# Patient Record
Sex: Male | Born: 1965 | Smoking: Never smoker
Health system: Southern US, Community
[De-identification: ages and names within clinical notes are randomized; demographics above are authoritative.]

## PROBLEM LIST (undated history)

## (undated) DIAGNOSIS — I10 Essential (primary) hypertension: Secondary | ICD-10-CM

---

## 2015-05-07 ENCOUNTER — Encounter (HOSPITAL_COMMUNITY): Payer: Self-pay | Admitting: Emergency Medicine

## 2015-05-07 ENCOUNTER — Emergency Department (HOSPITAL_COMMUNITY): Payer: BC Managed Care – PPO

## 2015-05-07 ENCOUNTER — Emergency Department (HOSPITAL_COMMUNITY)
Admission: EM | Admit: 2015-05-07 | Discharge: 2015-05-07 | Disposition: A | Discharge status: Home / Self Care | Payer: BC Managed Care – PPO | Attending: Emergency Medicine | Admitting: Emergency Medicine

## 2015-05-07 DIAGNOSIS — N200 Calculus of kidney: Secondary | ICD-10-CM | POA: Diagnosis not present

## 2015-05-07 DIAGNOSIS — R7989 Other specified abnormal findings of blood chemistry: Secondary | ICD-10-CM | POA: Diagnosis not present

## 2015-05-07 DIAGNOSIS — I10 Essential (primary) hypertension: Secondary | ICD-10-CM | POA: Insufficient documentation

## 2015-05-07 DIAGNOSIS — R109 Unspecified abdominal pain: Secondary | ICD-10-CM

## 2015-05-07 HISTORY — DX: Essential (primary) hypertension: I10

## 2015-05-07 LAB — COMPREHENSIVE METABOLIC PANEL
ALT: 22 U/L (ref 17–63)
AST: 24 U/L (ref 15–41)
Albumin: 3.8 g/dL (ref 3.5–5.0)
Alkaline Phosphatase: 61 U/L (ref 38–126)
Anion gap: 8 (ref 5–15)
BILIRUBIN TOTAL: 0.8 mg/dL (ref 0.3–1.2)
BUN: 12 mg/dL (ref 6–20)
CALCIUM: 8.5 mg/dL — AB (ref 8.9–10.3)
CHLORIDE: 108 mmol/L (ref 101–111)
CO2: 25 mmol/L (ref 22–32)
Creatinine, Ser: 1.45 mg/dL — ABNORMAL HIGH (ref 0.61–1.24)
GFR, EST NON AFRICAN AMERICAN: 56 mL/min — AB (ref >60)
GLUCOSE: 106 mg/dL — AB (ref 65–99)
Potassium: 3.6 mmol/L (ref 3.5–5.1)
Sodium: 141 mmol/L (ref 135–145)
Total Protein: 6.5 g/dL (ref 6.5–8.1)

## 2015-05-07 LAB — URINALYSIS, ROUTINE W REFLEX MICROSCOPIC
Bilirubin Urine: NEGATIVE
Glucose, UA: NEGATIVE mg/dL
HGB URINE DIPSTICK: NEGATIVE
Ketones, ur: NEGATIVE mg/dL
Leukocytes, UA: NEGATIVE
Nitrite: NEGATIVE
PROTEIN: NEGATIVE mg/dL
Specific Gravity, Urine: 1.024 (ref 1.005–1.030)
UROBILINOGEN UA: 1 mg/dL (ref 0.0–1.0)
pH: 6 (ref 5.0–8.0)

## 2015-05-07 LAB — LIPASE, BLOOD: LIPASE: 23 U/L (ref 22–51)

## 2015-05-07 LAB — CBC WITH DIFFERENTIAL/PLATELET
Basophils Absolute: 0 10*3/uL (ref 0.0–0.1)
Basophils Relative: 0 % (ref 0–1)
Eosinophils Absolute: 0.1 10*3/uL (ref 0.0–0.7)
Eosinophils Relative: 2 % (ref 0–5)
HCT: 41.5 % (ref 39.0–52.0)
Hemoglobin: 13.6 g/dL (ref 13.0–17.0)
LYMPHS ABS: 1.9 10*3/uL (ref 0.7–4.0)
Lymphocytes Relative: 26 % (ref 12–46)
MCH: 28.2 pg (ref 26.0–34.0)
MCHC: 32.8 g/dL (ref 30.0–36.0)
MCV: 86.1 fL (ref 78.0–100.0)
Monocytes Absolute: 0.6 10*3/uL (ref 0.1–1.0)
Monocytes Relative: 8 % (ref 3–12)
NEUTROS ABS: 4.7 10*3/uL (ref 1.7–7.7)
NEUTROS PCT: 64 % (ref 43–77)
Platelets: 180 10*3/uL (ref 150–400)
RBC: 4.82 MIL/uL (ref 4.22–5.81)
RDW: 13.3 % (ref 11.5–15.5)
WBC: 7.3 10*3/uL (ref 4.0–10.5)

## 2015-05-07 MED ORDER — SODIUM CHLORIDE 0.9 % IV BOLUS (SEPSIS)
1000.0000 mL | Freq: Once | INTRAVENOUS | Status: AC
  Administered 2015-05-07: 1000 mL via INTRAVENOUS

## 2015-05-07 MED ORDER — TAMSULOSIN HCL 0.4 MG PO CAPS: 0.4000 mg | ORAL_CAPSULE | Freq: Every day | ORAL | Status: AC

## 2015-05-07 MED ORDER — HYDROCODONE-ACETAMINOPHEN 5-325 MG PO TABS: 1.0000 | ORAL_TABLET | Freq: Four times a day (QID) | ORAL | Status: AC | PRN

## 2015-05-07 MED ORDER — NAPROXEN 500 MG PO TABS: 500.0000 mg | ORAL_TABLET | Freq: Two times a day (BID) | ORAL | Status: AC | PRN

## 2015-05-07 MED ORDER — KETOROLAC TROMETHAMINE 30 MG/ML IJ SOLN
30.0000 mg | Freq: Once | INTRAMUSCULAR | Status: AC
  Administered 2015-05-07: 30 mg via INTRAVENOUS
  Filled 2015-05-07: qty 1

## 2015-05-07 NOTE — ED Notes (Signed)
Patient here from [place] with c/o of sudden onset right flank pain non radiating. Denies n/v/d. No history of same.

## 2015-05-07 NOTE — ED Provider Notes (Signed)
CSN: [id number]     Arrival date & time 05/07/15  1348 History   First MD Initiated Contact with Patient 05/07/15 1350     Chief Complaint  Patient presents with  . Flank Pain     (Consider location/radiation/quality/duration/timing/severity/associated sxs/prior Treatment) HPI Comments: Wayne Powers is a 49 y.o. male with a PMHx of HTN, who presents to the ED with complaints of sudden onset right flank pain that began at 1 PM. He reports that initially it was 10/10, but now has improved to 3/10 in severity without any treatment prior to arrival. He reports that the flank pain is sharp, waxing and waning, constant, radiating to the right lateral/lower abdominal quadrant, with no known aggravating or alleviating factors given that he has not tried anything prior to arrival, and states that it feels like it's improving on its own. He denies any fevers, chills, chest pain, shortness breath, nausea, vomiting, diarrhea, constipation, obstipation, melena, hematochezia, dysuria, hematuria, urinary frequency, testicular pain or swelling, penile discharge, numbness, tingling, weakness, recent travel, sick contacts, suspicious food intake, alcohol use, NSAIDs, or recent antibiotics. Denies any history of prior similar symptoms. No hx of kidney stones or kidney issues.  Patient is a 50 y.o. male presenting with flank pain. The history is provided by the patient. No language interpreter was used.  Flank Pain This is a new problem. The current episode started today. The problem occurs constantly. The problem has been waxing and waning. Associated symptoms include abdominal pain (R lateral lower abd, radiating from flank). Pertinent negatives include no arthralgias, chest pain, chills, fever, myalgias, nausea, numbness, urinary symptoms, vomiting or weakness. Nothing aggravates the symptoms. He has tried nothing for the symptoms. The treatment provided no relief.    Past Medical History  Diagnosis Date  .  Hypertension    History reviewed. No pertinent past surgical history. History reviewed. No pertinent family history. History  Substance Use Topics  . Smoking status: Never Smoker   . Smokeless tobacco: Not on file  . Alcohol Use: No    Review of Systems  Constitutional: Negative for fever and chills.  Respiratory: Negative for shortness of breath.   Cardiovascular: Negative for chest pain.  Gastrointestinal: Positive for abdominal pain (R lateral lower abd, radiating from flank). Negative for nausea, vomiting, diarrhea, constipation and blood in stool.  Genitourinary: Positive for flank pain. Negative for dysuria, hematuria, discharge, scrotal swelling and testicular pain.  Musculoskeletal: Negative for myalgias and arthralgias.  Skin: Negative for color change.  Allergic/Immunologic: Negative for immunocompromised state.  Neurological: Negative for weakness and numbness.  Psychiatric/Behavioral: Negative for confusion.   10 Systems reviewed and are negative for acute change except as noted in the HPI.    Allergies  Review of patient's allergies indicates not on file.  Home Medications   Prior to Admission medications   Not on File   BP 138/91 mmHg  Pulse 62  Temp(Src) 98 F (36.7 C) (Oral)  Resp 15  SpO2 100% Physical Exam  Constitutional: He is oriented to person, place, and time. Vital signs are normal. He appears well-developed and well-nourished.  Non-toxic appearance. No distress.  Afebrile, nontoxic, NAD  HENT:  Head: Normocephalic and atraumatic.  Mouth/Throat: Oropharynx is clear and moist and mucous membranes are normal.  Eyes: Conjunctivae and EOM are normal. Right eye exhibits no discharge. Left eye exhibits no discharge.  Neck: Normal range of motion. Neck supple.  Cardiovascular: Normal rate, regular rhythm, normal heart sounds and intact distal pulses.  Exam reveals  no gallop and no friction rub.   No murmur heard. Pulmonary/Chest: Effort normal and  breath sounds normal. No respiratory distress. He has no decreased breath sounds. He has no wheezes. He has no rhonchi. He has no rales.  Abdominal: Soft. Normal appearance and bowel sounds are normal. He exhibits no distension. There is tenderness in the right lower quadrant. There is CVA tenderness (R sided). There is no rigidity, no rebound, no guarding, no tenderness at McBurney's point and negative Murphy's sign.    Soft, nondistended, +BS throughout, with R lateral abdomen tenderness at lateral edge of abdomen, approaching RLQ, no r/g/r, neg murphy's, neg mcburney's, +R sided CVA TTP, neg psoas sign, neg foot tap test  Musculoskeletal: Normal range of motion.  MAE x4 Strength and sensation grossly intact Distal pulses intact Gait steady  Neurological: He is alert and oriented to person, place, and time. He has normal strength. No sensory deficit.  Skin: Skin is warm, dry and intact. No rash noted.  Psychiatric: He has a normal mood and affect.  Nursing note and vitals reviewed.   ED Course  Procedures (including critical care time) Labs Review Labs Reviewed  COMPREHENSIVE METABOLIC PANEL - Abnormal; Notable for the following:    Glucose, Bld 106 (*)    Creatinine, Ser 1.45 (*)    Calcium 8.5 (*)    GFR calc non Af Amer 56 (*)    All other components within normal limits  CBC WITH DIFFERENTIAL/PLATELET  LIPASE, BLOOD  URINALYSIS, ROUTINE W REFLEX MICROSCOPIC (NOT AT [facility])    Imaging Review Ct Renal Stone Study  05/07/2015   CLINICAL DATA:  Right flank pain radiating to the right lower quadrant beginning earlier today.  EXAM: CT ABDOMEN AND PELVIS WITHOUT CONTRAST  TECHNIQUE: Multidetector CT imaging of the abdomen and pelvis was performed following the standard protocol without IV contrast.  COMPARISON:  None.  FINDINGS: Minimal dependent atelectasis is noted in the lung bases.  The liver, gallbladder, spleen, adrenal glands, and pancreas have an unremarkable unenhanced  appearance.  There are numerous punctate calculi measuring up to approximately 3 cm in size throughout both kidneys. There is mild stranding about the right ureter, and there is a 3 mm stone in the distal right ureter. There is very mild right hydronephrosis. No left hydronephrosis or left ureteral calculi are identified. There is mild scarring in the left upper pole.  There is no evidence of bowel obstruction or wall thickening. Appendix is identified in the right lower quadrant and is unremarkable. Bladder is unremarkable. No free fluid or enlarged lymph nodes are identified. Enthesophytes are noted at the lesser trochanters of the femurs bilaterally with linear calcifications present in the distal left iliopsoas tendon.  IMPRESSION: 1. 3 mm distal right ureteral stone with mild hydronephrosis. 2. Bilateral nonobstructing renal calculi.   Electronically Signed   By: Sebastian Ache   On: 05/07/2015 14:39     EKG Interpretation None      MDM   Final diagnoses:  Right flank pain  Nephrolithiasis  Elevated serum creatinine    49 y.o. male here with R flank pain, sudden onset 1hr PTA. Improving now without intervention, but waxes/wanes. No hx of similar symptoms. Radiates to RLQ. Abd exam reveals R lateral abd tenderness, no mcburney's tenderness, nonperitoneal. +R CVA TTP. DDx includes nephrolithiasis, appendicitis, musculoskeletal pain, etc. Will obtain labs, u/a, and CT renal search. Will give toradol and fluids. Will reassess shortly.   3:37 PM Pain improving. CT renal study showing  3mm stone in distal R ureter, and scattered b/l punctate stones in kidneys bilaterally. Pain controlled. CBC WNL, U/A unremarkable. CMP and lipase still pending. Discussed that as long as these are WNL, pt to be discharged with naprosyn/norco and flomax. Discussed urine strainer. Will have him use flomax only until stone has passed, or until symptoms have resolved (in the event he hasn't caught the stone). Will have him  f/up with urology. Pt doesn't live here, will have him f/up in [place] with urology, but still gave [facility]'s number so pt has at least one contact. Will await CMP and lipase then likely d/c.    3:51 PM Cr 1.45, unknown if this is his baseline or potentially just due to some slight dehydration. 1L bolus given here, pt tolerating PO well without nausea, no ongoing pain after toradol. Discussed that he will need to stay very hydrated with water, and I feel he is safe to d/c with previously discussed plan. I explained the diagnosis and have given explicit precautions to return to the ER including for any other new or worsening symptoms. The patient understands and accepts the medical plan as it's been dictated and I have answered their questions. Discharge instructions concerning home care and prescriptions have been given. The patient is STABLE and is discharged to home in good condition.  BP 138/91 mmHg  Pulse 62  Temp(Src) 98 F (36.7 C) (Oral)  Resp 15  SpO2 100%  Meds ordered this encounter  Medications  . sodium chloride 0.9 % bolus 1,000 mL    Sig:   . ketorolac (TORADOL) 30 MG/ML injection 30 mg    Sig:   . naproxen (NAPROSYN) 500 MG tablet    Sig: Take 1 tablet (500 mg total) by mouth 2 (two) times daily as needed for mild pain, moderate pain or headache (TAKE WITH MEALS.).    Dispense:  20 tablet    Refill:  0    Order Specific Question:  Supervising Provider    Answer:  MILLER, BRIAN [[id number]]  . HYDROcodone-acetaminophen (NORCO) 5-325 MG per tablet    Sig: Take 1 tablet by mouth every 6 (six) hours as needed for severe pain.    Dispense:  6 tablet    Refill:  0    Order Specific Question:  Supervising Provider    Answer:  MILLER, BRIAN [[id number]]  . tamsulosin (FLOMAX) 0.4 MG CAPS capsule    Sig: Take 1 capsule (0.4 mg total) by mouth daily after supper. Until the kidney stone has passed    Dispense:  15 capsule    Refill:  0    Order Specific Question:  Supervising  Provider    Answer:  Eber Hong [[id number]]      Devante Capano Camprubi-Soms, PA-C 05/07/15 1552  Tilden Fossa, MD 05/08/15 [phone]

## 2015-05-07 NOTE — Discharge Instructions (Signed)
Take naprosyn as directed as needed for inflammation and pain using tylenol or norco for breakthrough pain. Do not drive or operate machinery with pain medication use. Strain all urine to catch the stone when it passes. Use Flomax as directed until the stone has passed, as this medication will help you pass the stone. Stay VERY WELL HYDRATED. Followup with urologist in the next 1 week for recheck of ongoing pain, however for intractable or uncontrollable pain at home then return to the emergency department.    Kidney Stones Kidney stones (urolithiasis) are solid masses that form inside your kidneys. The intense pain is caused by the stone moving through the kidney, ureter, bladder, and urethra (urinary tract). When the stone moves, the ureter starts to spasm around the stone. The stone is usually passed in your pee (urine).  HOME CARE  Drink enough fluids to keep your pee clear or pale yellow. This helps to get the stone out.  Strain all pee through the provided strainer. Do not pee without peeing through the strainer, not even once. If you pee the stone out, catch it in the strainer. The stone may be as small as a grain of salt. Take this to your doctor. This will help your doctor figure out what you can do to try to prevent more kidney stones.  Only take medicine as told by your doctor.  Follow up with your doctor as told.  Get follow-up X-rays as told by your doctor. GET HELP IF: You have pain that gets worse even if you have been taking pain medicine. GET HELP RIGHT AWAY IF:   Your pain does not get better with medicine.  You have a fever or shaking chills.  Your pain increases and gets worse over 18 hours.  You have new belly (abdominal) pain.  You feel faint or pass out.  You are unable to pee. MAKE SURE YOU:   Understand these instructions.  Will watch your condition.  Will get help right away if you are not doing well or get worse. Document Released: 04/26/2008 Document  Revised: 07/11/2013 Document Reviewed: 04/11/2013 [facility] Patient Information 2015 [place], [place]. This information is not intended to replace advice given to you by your health care provider. Make sure you discuss any questions you have with your health care provider.  Dietary Guidelines to Help Prevent Kidney Stones Your risk of kidney stones can be decreased by adjusting the foods you eat. The most important thing you can do is drink enough fluid. You should drink enough fluid to keep your urine clear or pale yellow. The following guidelines provide specific information for the type of kidney stone you have had. GUIDELINES ACCORDING TO TYPE OF KIDNEY STONE Calcium Oxalate Kidney Stones  Reduce the amount of salt you eat. Foods that have a lot of salt cause your body to release excess calcium into your urine. The excess calcium can combine with a substance called oxalate to form kidney stones.  Reduce the amount of animal protein you eat if the amount you eat is excessive. Animal protein causes your body to release excess calcium into your urine. Ask your dietitian how much protein from animal sources you should be eating.  Avoid foods that are high in oxalates. If you take vitamins, they should have less than 500 mg of vitamin C. Your body turns vitamin C into oxalates. You do not need to avoid fruits and vegetables high in vitamin C. Calcium Phosphate Kidney Stones  Reduce the amount of salt  you eat to help prevent the release of excess calcium into your urine.  Reduce the amount of animal protein you eat if the amount you eat is excessive. Animal protein causes your body to release excess calcium into your urine. Ask your dietitian how much protein from animal sources you should be eating.  Get enough calcium from food or take a calcium supplement (ask your dietitian for recommendations). Food sources of calcium that do not increase your risk of kidney stones include:  Broccoli.  Dairy  products, such as cheese and yogurt.  Pudding. Uric Acid Kidney Stones  Do not have more than 6 oz of animal protein per day. FOOD SOURCES Animal Protein Sources  Meat (all types).  Poultry.  Eggs.  Fish, seafood. Foods High in [employer] seasonings.  Soy sauce.  Teriyaki sauce.  Cured and processed meats.  Salted crackers and snack foods.  Fast food.  Canned soups and most canned foods. Foods High in Oxalates  Grains:  Amaranth.  Barley.  Grits.  Wheat germ.  Bran.  Buckwheat flour.  All bran cereals.  Pretzels.  Whole wheat bread.  Vegetables:  Beans (wax).  Beets and beet greens.  Collard greens.  Eggplant.  [place]ole.  Leeks.  Okra.  Parsley.  Rutabagas.  Spinach.  Swiss chard.  Tomato paste.  Fried potatoes.  Sweet potatoes.  Fruits:  Red currants.  Figs.  Kiwi.  Rhubarb.  Meat and Other Protein Sources:  Beans (dried).  Soy burgers and other soybean products.  Miso.  Nuts (peanuts, almonds, pecans, cashews, hazelnuts).  Nut butters.  Sesame seeds and tahini (paste made of sesame seeds).  Poppy seeds.  Beverages:  Chocolate drink mixes.  Soy milk.  Instant iced tea.  Juices made from high-oxalate fruits or vegetables.  Other:  Carob.  Chocolate.  Fruitcake.  Marmalades. Document Released: 03/05/2011 Document Revised: 11/13/2013 Document Reviewed: 10/05/2013 [facility] Patient Information 2015 [place], [place]. This information is not intended to replace advice given to you by your health care provider. Make sure you discuss any questions you have with your health care provider.

## 2015-05-07 NOTE — ED Notes (Signed)
Bed: WA09 Expected date:  Expected time:  Means of arrival:  Comments: Ems- flank pain

## 2017-01-18 IMAGING — CT CT RENAL STONE PROTOCOL
2 of 3 series · 17 of 32 positions shown, 19 images · non-contrast
Comparison: None.

CLINICAL DATA: Right flank pain radiating to the right lower
quadrant beginning earlier today.

EXAM:
CT ABDOMEN AND PELVIS WITHOUT CONTRAST
TECHNIQUE: Multidetector CT imaging of the abdomen and pelvis was performed
following the standard protocol without IV contrast.

[Series 4: lung windows · axial · 0.69mm/px · z∈[-100,-26]mm · 14 of 18 slices shown, 16 images]
[im 2/18  soft-tissue]
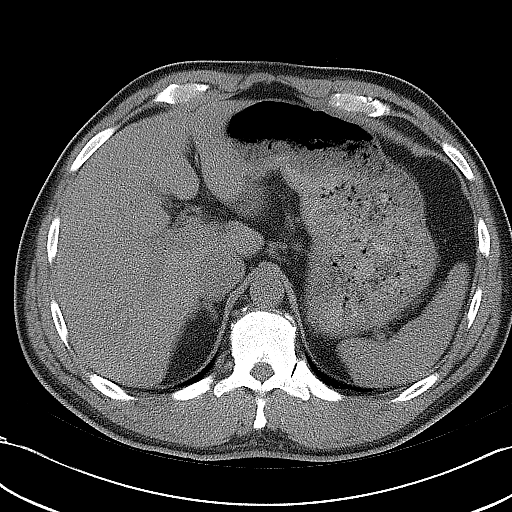
[im 2/18  bone]
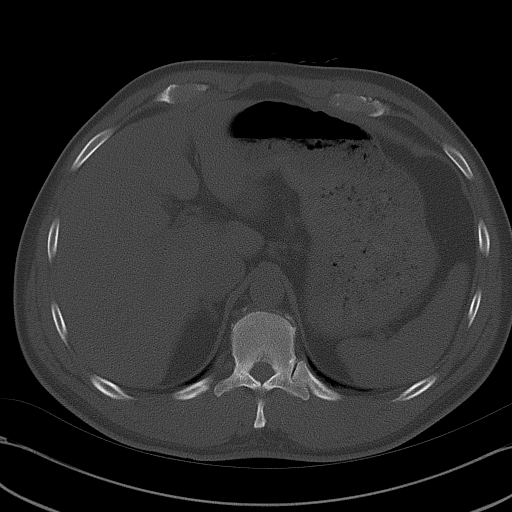
[im 3/18  soft-tissue]
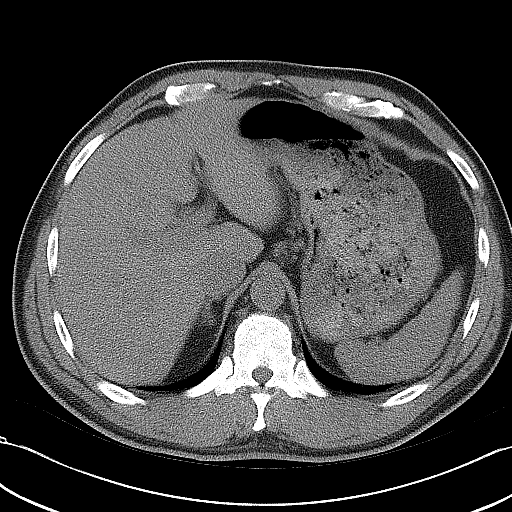
[im 4/18  soft-tissue]
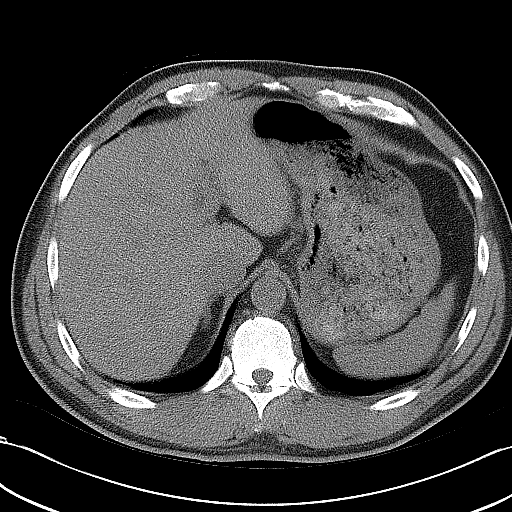
[im 6/18  soft-tissue]
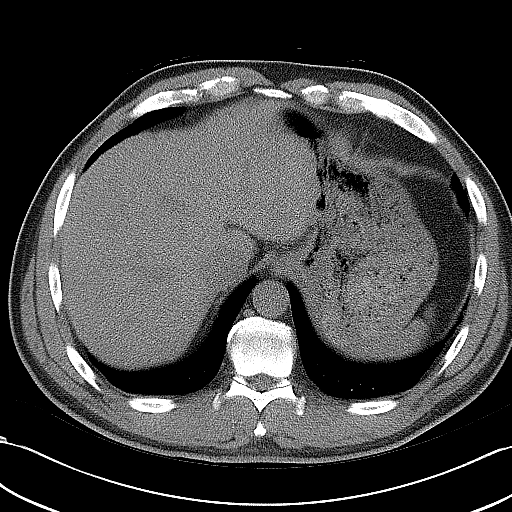
[im 7/18  soft-tissue]
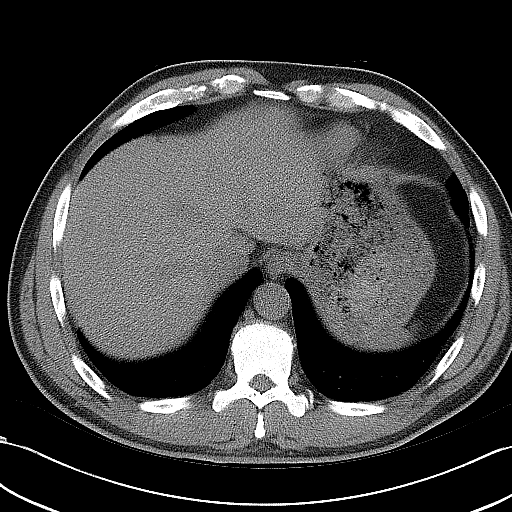
[im 8/18  soft-tissue]
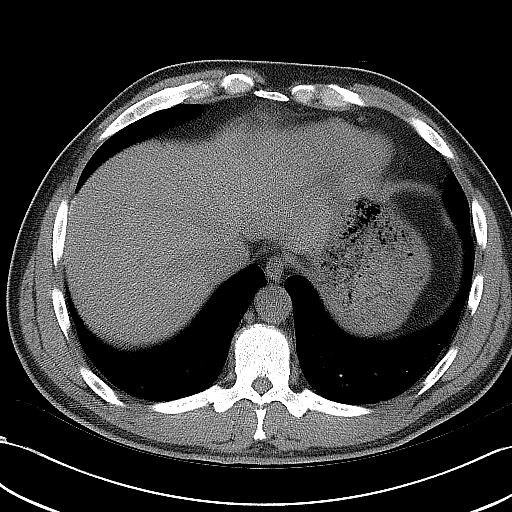
[im 9/18  soft-tissue]
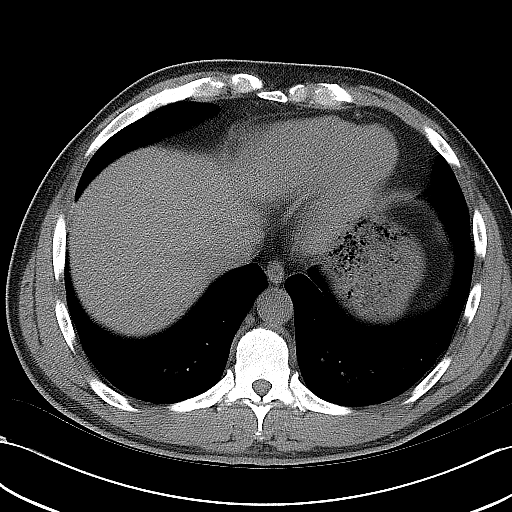
[im 10/18  soft-tissue]
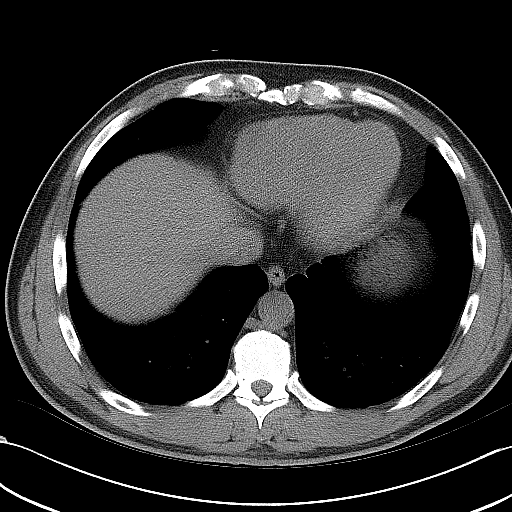
[im 11/18  soft-tissue]
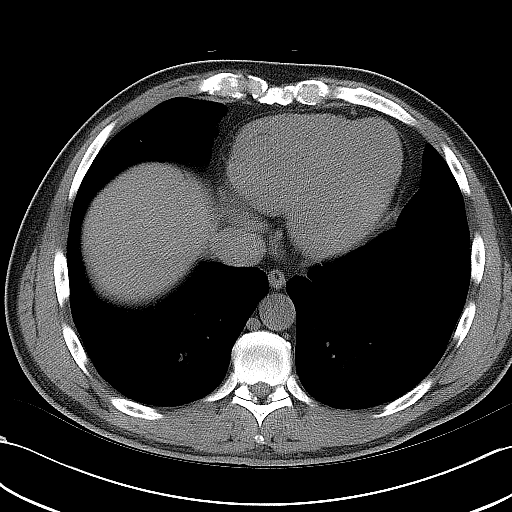
[im 11/18  bone]
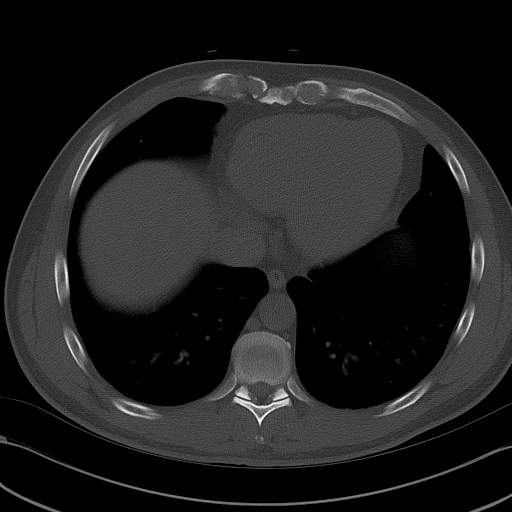
[im 12/18  soft-tissue]
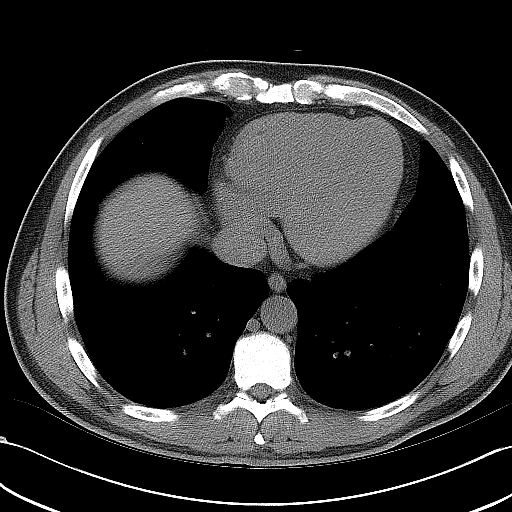
[im 14/18  soft-tissue]
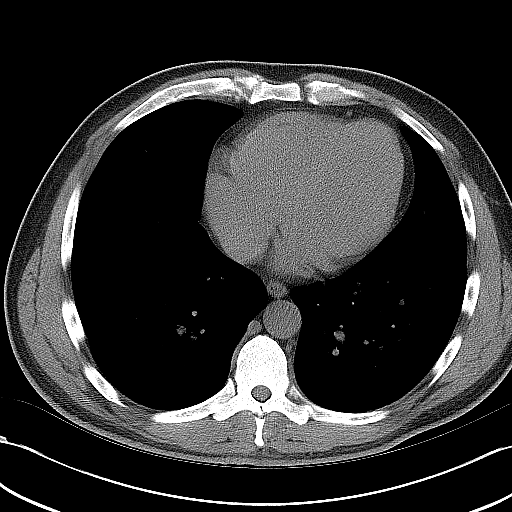
[im 15/18  soft-tissue]
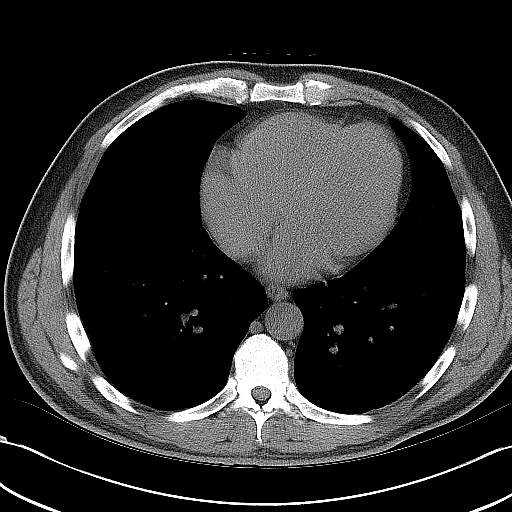
[im 16/18  soft-tissue]
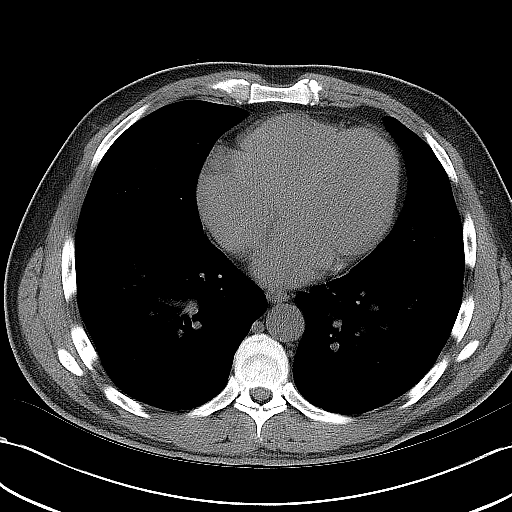
[im 17/18  soft-tissue]
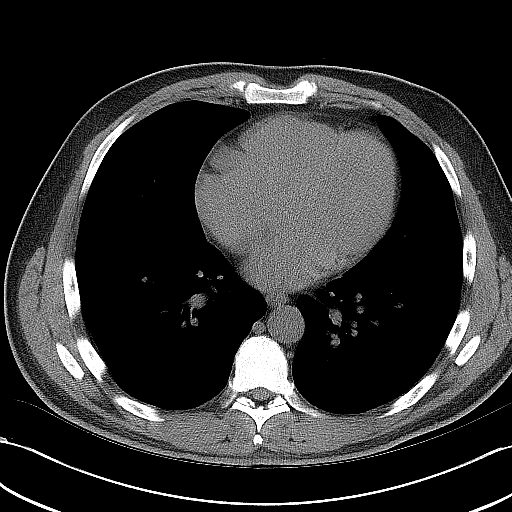

[Series 602: coronal images · coronal · 0.94mm/px · 3 of 80 slices shown]
[im 27/80  soft-tissue]
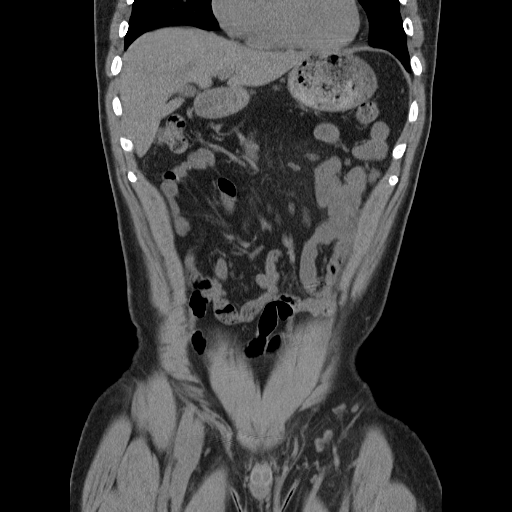
[im 36/80  soft-tissue]
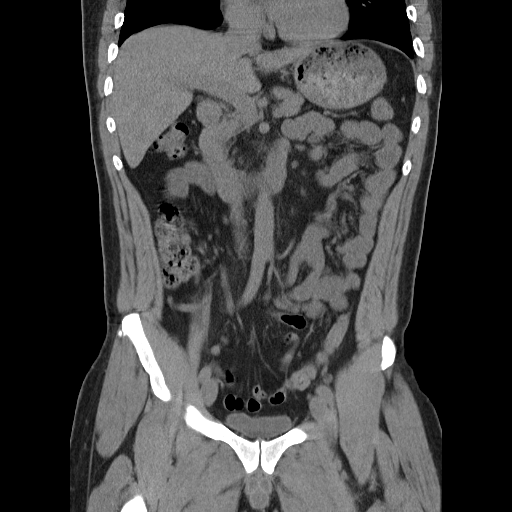
[im 44/80  soft-tissue]
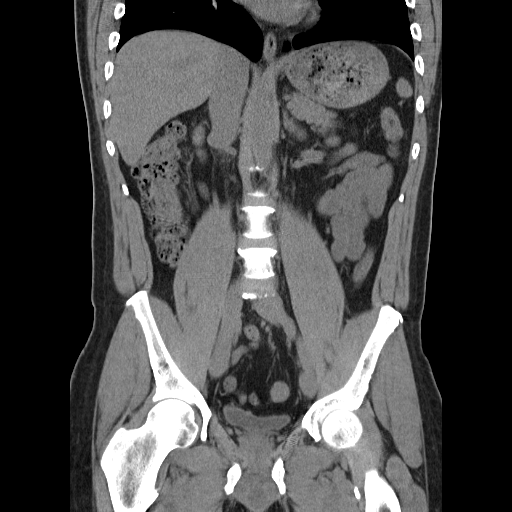

[17 of 32 positions shown; findings below may reference images not displayed]

FINDINGS: Minimal dependent atelectasis is noted in the lung bases.

The liver, gallbladder, spleen, adrenal glands, and pancreas have an
unremarkable unenhanced appearance.

There are numerous punctate calculi measuring up to approximately 3
cm in size throughout both kidneys. There is mild stranding about
the right ureter, and there is a 3 mm stone in the distal right
ureter. There is very mild right hydronephrosis. No left
hydronephrosis or left ureteral calculi are identified. There is
mild scarring in the left upper pole.

There is no evidence of bowel obstruction or wall thickening.
Appendix is identified in the right lower quadrant and is
unremarkable. Bladder is unremarkable. No free fluid or enlarged
lymph nodes are identified. Enthesophytes are noted at the lesser
trochanters of the femurs bilaterally with linear calcifications
present in the distal left iliopsoas tendon.
IMPRESSION: 1. 3 mm distal right ureteral stone with mild hydronephrosis.
2. Bilateral nonobstructing renal calculi.
